# Patient Record
Sex: Female | Born: 1960 | Hispanic: Refuse to answer | Marital: Single | Smoking: Never smoker
Health system: Southern US, Community
[De-identification: ages and names within clinical notes are randomized; demographics above are authoritative.]

## PROBLEM LIST (undated history)

## (undated) DIAGNOSIS — M1990 Unspecified osteoarthritis, unspecified site: Secondary | ICD-10-CM

## (undated) DIAGNOSIS — G35 Multiple sclerosis: Secondary | ICD-10-CM

## (undated) DIAGNOSIS — R42 Dizziness and giddiness: Secondary | ICD-10-CM

## (undated) DIAGNOSIS — N329 Bladder disorder, unspecified: Secondary | ICD-10-CM

## (undated) DIAGNOSIS — G43909 Migraine, unspecified, not intractable, without status migrainosus: Secondary | ICD-10-CM

## (undated) DIAGNOSIS — M4800 Spinal stenosis, site unspecified: Secondary | ICD-10-CM

## (undated) HISTORY — PX: ABDOMINAL HYSTERECTOMY: SHX81

## (undated) HISTORY — PX: NECK SURGERY: SHX720

## (undated) HISTORY — PX: CHOLECYSTECTOMY: SHX55

---

## 2008-11-04 ENCOUNTER — Ambulatory Visit: Payer: Self-pay | Admitting: Gastroenterology

## 2008-11-21 ENCOUNTER — Ambulatory Visit: Payer: Self-pay | Admitting: Gastroenterology

## 2008-12-21 ENCOUNTER — Ambulatory Visit: Payer: Self-pay | Admitting: Gastroenterology

## 2009-12-05 ENCOUNTER — Ambulatory Visit: Payer: Self-pay | Admitting: Gastroenterology

## 2009-12-19 ENCOUNTER — Ambulatory Visit: Payer: Self-pay | Admitting: Gastroenterology

## 2010-01-02 ENCOUNTER — Ambulatory Visit: Payer: Self-pay | Admitting: Gastroenterology

## 2010-01-16 ENCOUNTER — Ambulatory Visit: Payer: Self-pay | Admitting: Gastroenterology

## 2011-03-01 ENCOUNTER — Ambulatory Visit (INDEPENDENT_AMBULATORY_CARE_PROVIDER_SITE_OTHER): Payer: Self-pay

## 2011-03-01 DIAGNOSIS — K5900 Constipation, unspecified: Secondary | ICD-10-CM

## 2011-03-18 ENCOUNTER — Ambulatory Visit (INDEPENDENT_AMBULATORY_CARE_PROVIDER_SITE_OTHER): Payer: Self-pay

## 2011-03-18 ENCOUNTER — Ambulatory Visit: Payer: Self-pay

## 2011-03-18 DIAGNOSIS — K5900 Constipation, unspecified: Secondary | ICD-10-CM

## 2011-04-01 ENCOUNTER — Ambulatory Visit (INDEPENDENT_AMBULATORY_CARE_PROVIDER_SITE_OTHER): Payer: Self-pay

## 2011-04-01 DIAGNOSIS — K5900 Constipation, unspecified: Secondary | ICD-10-CM

## 2011-04-16 ENCOUNTER — Ambulatory Visit: Payer: Self-pay

## 2011-05-15 ENCOUNTER — Ambulatory Visit: Payer: Self-pay

## 2011-06-14 ENCOUNTER — Ambulatory Visit: Payer: Self-pay

## 2012-02-12 ENCOUNTER — Ambulatory Visit: Payer: Self-pay

## 2012-07-29 ENCOUNTER — Ambulatory Visit: Payer: Self-pay

## 2012-10-13 ENCOUNTER — Ambulatory Visit: Payer: Self-pay

## 2015-06-26 ENCOUNTER — Encounter (HOSPITAL_BASED_OUTPATIENT_CLINIC_OR_DEPARTMENT_OTHER): Payer: Self-pay | Admitting: Emergency Medicine

## 2015-06-26 ENCOUNTER — Other Ambulatory Visit: Payer: Self-pay

## 2015-06-26 ENCOUNTER — Emergency Department (HOSPITAL_BASED_OUTPATIENT_CLINIC_OR_DEPARTMENT_OTHER): Payer: Medicaid Other

## 2015-06-26 ENCOUNTER — Emergency Department (HOSPITAL_BASED_OUTPATIENT_CLINIC_OR_DEPARTMENT_OTHER)
Admission: EM | Admit: 2015-06-26 | Discharge: 2015-06-26 | Disposition: A | Discharge status: Home / Self Care | Payer: Medicaid Other | Attending: Emergency Medicine | Admitting: Emergency Medicine

## 2015-06-26 DIAGNOSIS — R42 Dizziness and giddiness: Secondary | ICD-10-CM | POA: Insufficient documentation

## 2015-06-26 DIAGNOSIS — R05 Cough: Secondary | ICD-10-CM | POA: Insufficient documentation

## 2015-06-26 DIAGNOSIS — R0789 Other chest pain: Secondary | ICD-10-CM | POA: Insufficient documentation

## 2015-06-26 DIAGNOSIS — M791 Myalgia: Secondary | ICD-10-CM | POA: Diagnosis present

## 2015-06-26 DIAGNOSIS — N390 Urinary tract infection, site not specified: Secondary | ICD-10-CM | POA: Diagnosis not present

## 2015-06-26 DIAGNOSIS — R6889 Other general symptoms and signs: Secondary | ICD-10-CM

## 2015-06-26 DIAGNOSIS — R0602 Shortness of breath: Secondary | ICD-10-CM | POA: Diagnosis not present

## 2015-06-26 DIAGNOSIS — R0981 Nasal congestion: Secondary | ICD-10-CM | POA: Diagnosis not present

## 2015-06-26 DIAGNOSIS — R51 Headache: Secondary | ICD-10-CM | POA: Insufficient documentation

## 2015-06-26 DIAGNOSIS — R5383 Other fatigue: Secondary | ICD-10-CM | POA: Insufficient documentation

## 2015-06-26 HISTORY — DX: Spinal stenosis, site unspecified: M48.00

## 2015-06-26 HISTORY — DX: Multiple sclerosis: G35

## 2015-06-26 HISTORY — DX: Bladder disorder, unspecified: N32.9

## 2015-06-26 HISTORY — DX: Unspecified osteoarthritis, unspecified site: M19.90

## 2015-06-26 LAB — URINALYSIS, ROUTINE W REFLEX MICROSCOPIC
BILIRUBIN URINE: NEGATIVE
GLUCOSE, UA: NEGATIVE mg/dL
HGB URINE DIPSTICK: NEGATIVE
Ketones, ur: NEGATIVE mg/dL
NITRITE: NEGATIVE
PH: 7 (ref 5.0–8.0)
Protein, ur: NEGATIVE mg/dL
SPECIFIC GRAVITY, URINE: 1.014 (ref 1.005–1.030)

## 2015-06-26 LAB — CBC
HCT: 42.4 % (ref 36.0–46.0)
HEMOGLOBIN: 13.3 g/dL (ref 12.0–15.0)
MCH: 28.1 pg (ref 26.0–34.0)
MCHC: 31.4 g/dL (ref 30.0–36.0)
MCV: 89.6 fL (ref 78.0–100.0)
PLATELETS: 242 10*3/uL (ref 150–400)
RBC: 4.73 MIL/uL (ref 3.87–5.11)
RDW: 14.6 % (ref 11.5–15.5)
WBC: 7.1 10*3/uL (ref 4.0–10.5)

## 2015-06-26 LAB — URINE MICROSCOPIC-ADD ON

## 2015-06-26 LAB — COMPREHENSIVE METABOLIC PANEL
ALBUMIN: 3.9 g/dL (ref 3.5–5.0)
ALK PHOS: 61 U/L (ref 38–126)
ALT: 18 U/L (ref 14–54)
ANION GAP: 8 (ref 5–15)
AST: 26 U/L (ref 15–41)
BILIRUBIN TOTAL: 0.7 mg/dL (ref 0.3–1.2)
BUN: 9 mg/dL (ref 6–20)
CALCIUM: 9.4 mg/dL (ref 8.9–10.3)
CO2: 27 mmol/L (ref 22–32)
CREATININE: 1.18 mg/dL — AB (ref 0.44–1.00)
Chloride: 103 mmol/L (ref 101–111)
GFR calc Af Amer: 60 mL/min — ABNORMAL LOW (ref >60)
GFR calc non Af Amer: 52 mL/min — ABNORMAL LOW (ref >60)
GLUCOSE: 117 mg/dL — AB (ref 65–99)
Potassium: 4.1 mmol/L (ref 3.5–5.1)
Sodium: 138 mmol/L (ref 135–145)
TOTAL PROTEIN: 8.1 g/dL (ref 6.5–8.1)

## 2015-06-26 LAB — TROPONIN I

## 2015-06-26 LAB — LIPASE, BLOOD: Lipase: 26 U/L (ref 11–51)

## 2015-06-26 LAB — I-STAT CG4 LACTIC ACID, ED: Lactic Acid, Venous: 0.97 mmol/L (ref 0.5–2.0)

## 2015-06-26 MED ORDER — ACETAMINOPHEN 325 MG PO TABS
ORAL_TABLET | ORAL | Status: AC
  Administered 2015-06-26: 650 mg
  Filled 2015-06-26: qty 2

## 2015-06-26 MED ORDER — METOCLOPRAMIDE HCL 5 MG/ML IJ SOLN
10.0000 mg | Freq: Once | INTRAMUSCULAR | Status: AC
  Administered 2015-06-26: 10 mg via INTRAVENOUS
  Filled 2015-06-26: qty 2

## 2015-06-26 MED ORDER — DEXTROSE 5 % IV SOLN
1.0000 g | Freq: Once | INTRAVENOUS | Status: AC
  Administered 2015-06-26: 1 g via INTRAVENOUS
  Filled 2015-06-26: qty 10

## 2015-06-26 MED ORDER — DEXAMETHASONE SODIUM PHOSPHATE 10 MG/ML IJ SOLN
10.0000 mg | Freq: Once | INTRAMUSCULAR | Status: AC
  Administered 2015-06-26: 10 mg via INTRAVENOUS
  Filled 2015-06-26: qty 1

## 2015-06-26 MED ORDER — SODIUM CHLORIDE 0.9 % IV BOLUS (SEPSIS)
1000.0000 mL | Freq: Once | INTRAVENOUS | Status: AC
  Administered 2015-06-26: 1000 mL via INTRAVENOUS

## 2015-06-26 MED ORDER — KETOROLAC TROMETHAMINE 30 MG/ML IJ SOLN
30.0000 mg | Freq: Once | INTRAMUSCULAR | Status: AC
  Administered 2015-06-26: 30 mg via INTRAVENOUS
  Filled 2015-06-26: qty 1

## 2015-06-26 MED ORDER — ONDANSETRON HCL 4 MG/2ML IJ SOLN
4.0000 mg | Freq: Once | INTRAMUSCULAR | Status: AC | PRN
  Administered 2015-06-26: 4 mg via INTRAVENOUS
  Filled 2015-06-26: qty 2

## 2015-06-26 MED ORDER — CEPHALEXIN 500 MG PO CAPS: 500.0000 mg | ORAL_CAPSULE | Freq: Three times a day (TID) | ORAL | Status: DC

## 2015-06-26 MED ORDER — DIPHENHYDRAMINE HCL 50 MG/ML IJ SOLN
12.5000 mg | Freq: Once | INTRAMUSCULAR | Status: AC
  Administered 2015-06-26: 12.5 mg via INTRAVENOUS
  Filled 2015-06-26: qty 1

## 2015-06-26 MED ORDER — OSELTAMIVIR PHOSPHATE 75 MG PO CAPS: 75.0000 mg | ORAL_CAPSULE | Freq: Two times a day (BID) | ORAL | Status: DC

## 2015-06-26 NOTE — ED Notes (Signed)
Patient states that she went to [facility] and was told that is was a 4 hour wait. The patient had a fever of 99.5 at [facility] - no treatment. The patient reports that she is having generalized pain, and weakness all over with nausea. Patient has fever of 101.5 here. The patient also reports that she has pain in her chest area when she breaths.

## 2015-06-26 NOTE — Discharge Instructions (Signed)
1. Medications: tamiflu, keflex, usual home medications 2. Treatment: rest, drink plenty of fluids 3. Follow Up: please followup with your primary doctor for discussion of your diagnoses and further evaluation after today's visit; if you do not have a primary care doctor use the resource guide provided to find one; please return to the ER for high fever, increased pain, new or worsening symptoms   Emergency Department Resource Guide 1) Find a Doctor and Pay Out of Pocket Although you won't have to find out who is covered by your insurance plan, it is a good idea to ask around and get recommendations. You will then need to call the office and see if the doctor you have chosen will accept you as a new patient and what types of options they offer for patients who are self-pay. Some doctors offer discounts or will set up payment plans for their patients who do not have insurance, but you will need to ask so you aren't surprised when you get to your appointment.  2) Contact Your Local Health Department Not all health departments have doctors that can see patients for sick visits, but many do, so it is worth a call to see if yours does. If you don't know where your local health department is, you can check in your phone book. The CDC also has a tool to help you locate your state's health department, and many state websites also have listings of all of their local health departments.  3) Find a Walk-in Clinic If your illness is not likely to be very severe or complicated, you may want to try a walk in clinic. These are popping up all over the country in pharmacies, drugstores, and shopping centers. They're usually staffed by nurse practitioners or physician assistants that have been trained to treat common illnesses and complaints. They're usually fairly quick and inexpensive. However, if you have serious medical issues or chronic medical problems, these are probably not your best option.  No Primary Care  Doctor: - Call Health Connect at  [phone] - they can help you locate a primary care doctor that  accepts your insurance, provides certain services, etc. - Physician Referral Service- [phone]  Chronic Pain Problems: Organization         Address  Phone   Notes  [facility]  [phone] Patients need to be referred by their primary care doctor.   Medication Assistance: Organization         Address  Phone   Notes  [facility] Medication [facility] [address] [place] [place], [place] [address] [phone] --Must be a resident of [facility] -- Must have NO insurance coverage whatsoever (no Medicaid/ Medicare, etc.) -- The pt. MUST have a primary care doctor that directs their care regularly and follows them in the community   [facility]ist  [phone]   [employer]  [phone]    Agencies that provide inexpensive medical care: Organization         Address  Phone   Notes  [facility]  [phone]   [facility]    [phone]   [facility] [address] [place], [place] [address] [phone]   [facility] [address] [place], [place] [phone]   Planned Parenthood    [phone]   [facility]    [phone]   [facility] and [facility]  [address]  Mardene Speak Phone:  [phone], Fax:  [phone] Hours of Operation:  9 am - 6 pm, M-F.  Also accepts Medicaid/Medicare and self-pay.  [facility] for [place], [address], [place] Phone: [phone], Fax: [phone]. Hours of Operation:  8:30 am - 5:30 pm, M-F.  Also accepts Medicaid and self-pay.  [facility] [address], [place] Phone: [phone]   [place], [place], [place] [phone], Ext. [phone] Mondays & Thursdays: 7-9 AM.  First 15 patients are seen on a first  come, first serve basis.    [place] Providers:  Organization         Address  Phone   Notes  [facility] [address], [place] [phone] Also accepts self-pay patients.  [facility] [id number] [place], [place]  [phone]   [place], [address], [place] [phone]   [facility] [address] [place] [phone]   Lucianne Lei [address], [place]   [phone] Only accepts [place] Access [place] patients after they have their name applied to their card.   Self-Pay (no insurance) in [facility]:  Organization         Address  Phone   Notes  Sickle Cell Patients, [facility] [place] [phone]   [facility] Urgent Care [place] [phone]   [facility] [place]  [place], [place], [place] [phone]   [facility]/Dr. Osei-Bonsu  [address] [place] or [address], [place] [phone] Phone number for both [place] and [place] locations is the same.  [facility]gent [facility] and [facility] [address] [place] [phone]   [facility] [address], [place] or [address] [phone]   [facility] [address], [place] [phone], phone; [phone], fax Sees patients 1st and 3rd Saturday of every month.  Must not qualify for public or private insurance (i.e. Medicaid, Medicare, [place] Health Choice, Veterans' Benefits)  Household income should be no more than 200% of the poverty level The clinic cannot treat you if you are pregnant or think you are pregnant  Sexually transmitted diseases are not treated at the clinic.    Dental Care: Organization          Address  Phone  Notes  [facility] [place] [phone] Accepts children up to age 30 who are enrolled in [place] or [place]; pregnant women with a Medicaid card; and children who have applied for Medicaid or [place] Health Choice, but were declined, whose parents can pay a reduced fee at time of service.  [facility] Department of [facility]  [address], [place] [phone] Accepts children up to age 23 who are enrolled in [place] or [place]; pregnant women with a Medicaid card; and children who have applied for Medicaid or  Health Choice, but were declined, whose parents can pay a reduced fee at time of service.  [facility]  [place] [phone] Patients are seen  by appointment only. Walk-ins are not accepted. [place] will see patients 67 years of age and older. Monday - Tuesday (8am-5pm) Most Wednesdays (8:30-5pm) $30 per visit, cash only  [facility] Access [facility]  [address], [facility] [phone] Patients are seen by appointment only. Walk-ins are not accepted. [place] will see patients 89 years of age and older. One Wednesday Evening (Monthly: Volunteer Based).  $30 per visit, cash only  [place]  [phone] for adults; Children under age 54, call Graduate Pediatric Dentistry at [phone]. Children aged 71-14, please call [phone] to request a pediatric application.  Dental services are provided in all areas of dental care including fillings, crowns and bridges, complete and partial dentures, implants, gum treatment, root canals, and extractions. Preventive care is also provided. Treatment is provided to both adults and children. Patients are selected via a lottery and there is often a waiting list.   [facility] [address], [place]  [phone] [email or web address]   [facility] [address] [place], [place] [phone], Ext. [phone] Second and Fourth Thursday of each month, opens at 6:30 AM; Clinic ends at 9 AM.  Patients are seen on a first-come first-served basis, and a limited number are seen during each clinic.   [facility]  [address] Hillard Danker [place], [place] [phone]   Eligibility Requirements You must have lived in [place], [place], or [place] counties for at least the last three months.   You cannot be eligible for state or federal sponsored [employer], including [employer], [place], or [employer].   You generally cannot be eligible for healthcare insurance through your employer.    How to apply: Eligibility screenings are held every Tuesday and Wednesday afternoon from 1:00 pm until 4:00 pm. You do not need an appointment for the interview!  [facility] [address], [place], [place]   [place]  [place] Department  [place]  [phone]    Behavioral Health Resources in the Community: Intensive Outpatient Programs Organization         Address  Phone  Notes  [place]. [address], [place], [place] [phone]   [facility] Outpatient [address] [place], [place]   ADS: Alcohol & Drug S[facility]cs [address], [place], [place]   [place] [address]  [place], [place] or [phone]   Substance Abuse Resources Organization         Address  Phone  Notes  Alcohol and Drug Services  [phone]   [place]  [phone]   [facility] [place]   Chinita Pester  [phone]   Residential & Outpatient Substance Abuse Program  [phone]   Psychological  Services Organization         Address  Phone  Notes  [facility]  [place]  [phone]   [place] [address] [place] or [phone]    Mobile Crisis Teams Organization         Address  Phone  Notes  [facility], Mobile Crisis Care Unit  [phone]   [facility]ive Psychotherapeutic Services  [address] [place], [place]   [facility] [address] [place]  [phone]    Self-Help/Support Groups Organization         Address  Phone             Notes  Mental Health Assoc. of [place] - variety of support groups  [place] Call for more information  Narcotics Anonymous (NA), Caring Services [address], [employer] [place]  2 meetings at this location   Special educational needs teacher         Address  Phone  Notes  ASAP Residential Treatment [place],    [place]  [phone]   [facility]  [address] [place] [id number], [place], [place]   [place], [place] [phone] Admissions: 8am-3pm M-F  Incentives Substance [place] [address]    [place], [place] [phone]   [facility] [address] [place], [place], [place]   [facility] [address]  [place], [place]   Insight Programs - Intensive Outpatient [address] Kristeen [address], [place], [place]   [facility] ([place].,  [place], [place] [phone] or [phone]   [facility] Services (RTS) [address] [place], [place] Accepts Medicaid  [facility] [place] [address]  [place] [phone] Substance Abuse/Addiction Treatment   [facility] Organization         Address  Phone  Notes  [facility]  [phone]   Domenic Schwab, PhD [address] Arlis Porta [place], [place]   [phone] or [phone]   [place], [place] [phone]   [facility] [address], [place], [place] [phone] Insurance/Medicaid/sponsorship through [facility] and Families [address] [place]                                    [place], [place] [phone] [place] [address], [place] [phone]    Dr. Adele Schilder  [phone]   [facility] Dept. 1) [address] [place] 2) [place] 3)  [address], [place] [phone]  [phone]   [place] [phone] or [phone] (After Hours)

## 2015-06-26 NOTE — ED Notes (Signed)
Pt verbalizes understanding of d/c instructions and denies any further needs at this time. 

## 2015-06-26 NOTE — ED Provider Notes (Signed)
CSN: [id number]     Arrival date & time 06/26/15  1609 History   First MD Initiated Contact with Patient 06/26/15 1650     Chief Complaint  Patient presents with  . Muscle Pain    HPI   Shannon Perkins is a 55 y.o. female with a PMH of MS, spinal stenosis, arthritis who presents to the ED with fever, nasal congestion, cough, generalized body aches, nausea, and vomiting. She notes her symptoms started this morning. She denies exacerbating or alleviating factors. She also reports intermittent central chest pain with cough, shortness of breath, epigastric abdominal pain, and urinary frequency. She denies hematemesis, diarrhea, constipation, hematochezia, melena, dysuria, urgency. Additionally, she states she has a migraine, which she notes is consistent with her history of migraine. She reports associated lightheadedness. She denies vision changes, new focal weakness (has chronic R sided weakness 2/2 MS, unchanged from baseline), numbness, paresthesia.  Past Medical History  Diagnosis Date  . MS (multiple sclerosis) (HCC)   . Spinal stenosis   . Arthritis   . Bladder disorder    Past Surgical History  Procedure Laterality Date  . Neck surgery    . Abdominal hysterectomy     Family History  Problem Relation Age of Onset  . Rashes / Skin problems Mother    Social History  Substance Use Topics  . Smoking status: Never Smoker   . Smokeless tobacco: None  . Alcohol Use: None   OB History    No data available      Review of Systems  Constitutional: Positive for fever, chills and fatigue.  HENT: Positive for congestion.   Eyes: Negative for visual disturbance.  Respiratory: Positive for cough and shortness of breath.   Cardiovascular: Positive for chest pain.  Gastrointestinal: Positive for nausea, vomiting and abdominal pain. Negative for diarrhea and constipation.  Genitourinary: Negative for dysuria, urgency and frequency.  Musculoskeletal: Positive for myalgias.  Neurological:  Positive for light-headedness and headaches. Negative for syncope, weakness and numbness.  All other systems reviewed and are negative.     Allergies  Other  Home Medications   Prior to Admission medications   Medication Sig Start Date End Date Taking? Authorizing Provider  cephALEXin (KEFLEX) 500 MG capsule Take 1 capsule (500 mg total) by mouth 3 (three) times daily. 06/26/15   Mady Gemma, PA-C  oseltamivir (TAMIFLU) 75 MG capsule Take 1 capsule (75 mg total) by mouth every 12 (twelve) hours. 06/26/15   Mady Gemma, PA-C    BP 131/72 mmHg  Pulse 87  Temp(Src) 100.5 F (38.1 C) (Oral)  Resp 20  Ht  (1.626 m)  Wt 112.492 kg  BMI 42.55 kg/m2  SpO2 98% Physical Exam  Constitutional: She is oriented to person, place, and time. She appears well-developed and well-nourished. No distress.  Patient appears uncomfortable due to pain.  HENT:  Head: Normocephalic and atraumatic.  Right Ear: External ear normal.  Left Ear: External ear normal.  Nose: Nose normal.  Mouth/Throat: Uvula is midline, oropharynx is clear and moist and mucous membranes are normal.  Eyes: Conjunctivae, EOM and lids are normal. Pupils are equal, round, and reactive to light. Right eye exhibits no discharge. Left eye exhibits no discharge. No scleral icterus.  Neck: Normal range of motion. Neck supple.  Cardiovascular: Normal rate, regular rhythm, normal heart sounds, intact distal pulses and normal pulses.   Pulmonary/Chest: Effort normal and breath sounds normal. No respiratory distress. She has no wheezes. She has no rales. She  exhibits tenderness.  Anterior chest wall tender to palpation, patient states this reproduces her pain.  Abdominal: Soft. Normal appearance and bowel sounds are normal. She exhibits no distension and no mass. There is tenderness. There is no rigidity, no rebound, no guarding and no CVA tenderness.  Tenderness to palpation in epigastric region. No rebound, guarding,  or masses.  Musculoskeletal: Normal range of motion. She exhibits no edema or tenderness.  Neurological: She is alert and oriented to person, place, and time. No cranial nerve deficit or sensory deficit.  Strength 4/5 right upper and lower extremity, patient states this is unchanged from baseline.  Skin: Skin is warm, dry and intact. No rash noted. She is not diaphoretic. No erythema. No pallor.  Psychiatric: She has a normal mood and affect. Her speech is normal and behavior is normal.  Nursing note and vitals reviewed.   ED Course  Procedures (including critical care time)  Labs Review Labs Reviewed  COMPREHENSIVE METABOLIC PANEL - Abnormal; Notable for the following:    Glucose, Bld 117 (*)    Creatinine, Ser 1.18 (*)    GFR calc non Af Amer 52 (*)    GFR calc Af Amer 60 (*)    All other components within normal limits  URINALYSIS, ROUTINE W REFLEX MICROSCOPIC (NOT AT [facility]) - Abnormal; Notable for the following:    APPearance CLOUDY (*)    Leukocytes, UA LARGE (*)    All other components within normal limits  URINE MICROSCOPIC-ADD ON - Abnormal; Notable for the following:    Squamous Epithelial / LPF 0-5 (*)    Bacteria, UA MANY (*)    All other components within normal limits  URINE CULTURE  LIPASE, BLOOD  CBC  TROPONIN I  I-STAT CG4 LACTIC ACID, ED    Imaging Review Dg Chest 2 View  06/26/2015  CLINICAL DATA:  55 year old female with chest pain and cough EXAM: CHEST  2 VIEW COMPARISON:  None. FINDINGS: The lungs are clear and negative for focal airspace consolidation, pulmonary edema or suspicious pulmonary nodule. No pleural effusion or pneumothorax. Cardiac and mediastinal contours are within normal limits. No acute fracture or lytic or blastic osseous lesions. The visualized upper abdominal bowel gas pattern is unremarkable. Surgical clips in the right upper quadrant suggest prior cholecystectomy. IMPRESSION: No active cardiopulmonary disease. Electronically Signed   By:  Malachy Moan M.D.   On: 06/26/2015 17:53   I have personally reviewed and evaluated these images and lab results as part of my medical decision-making.   EKG Interpretation   Date/Time:  Monday June 26 2015 17:49:55 EST Ventricular Rate:  88 PR Interval:  178 QRS Duration: 90 QT Interval:  368 QTC Calculation: 445 R Axis:   75 Text Interpretation:  Normal sinus rhythm Normal ECG Confirmed by DELO   MD, DOUGLAS ([id number]) on 06/26/2015 7:40:47 PM      MDM   Final diagnoses:  Flu-like symptoms  UTI (lower urinary tract infection)    55 year old female presents with fever, nasal congestion, cough, generalized body aches, nausea, and vomiting. Also reports intermittent central chest pain with cough, shortness of breath, epigastric abdominal pain, and urinary frequency. Denies hematemesis, diarrhea, constipation, hematochezia, melena, dysuria, urgency. States she has a migraine, which she notes is consistent with her history of migraine. Reports associated lightheadedness. Denies vision changes, new focal weakness (has chronic R sided weakness 2/2 MS, unchanged from baseline), numbness, paresthesia.  Patient's initial temp 101.5. No tachycardia or hypotension. Heart regular rate and  rhythm. Lungs clear to auscultation bilaterally. Anterior chest wall tender to palpation, patient states this reproduces her pain. Abdomen soft, non-distended, with mild tenderness to palpation in epigastric region. No rebound, guarding, or masses. Strength 4/5 in RUE and RLE, which patient states is unchanged from baseline. Otherwise, normal neuro exam with no focal deficit.  EKG sinus rhythm. Troponin negative. Chest x-Ballew no active cardiopulmonary disease. CBC negative for leukocytosis or anemia. CMP remarkable for creatinine 1.18. Lipase within normal limits. Lactic acid within normal limits. UA remarkable for large leukocytes, 6-30 WBC, many bacteria. Urine culture ordered.  Patient given fluids,  reglan, benadryl, and toradol in the ED. On reassessment, she reports significant symptom improvement. Symptoms flu like, no flu testing available, however will treat with tamiflu given onset of symptoms within 48 hours. Will give dose of rocephin in the ED for UTI and discharge with keflex. Patient is non-toxic and well-appearing, feel she is stable for discharge at this time. Repeat temp 98.5. Patient to follow-up with PCP. Return precautions discussed. Patient verbalizes her understanding and is in agreement with plan.  BP 120/70 mmHg  Pulse 80  Temp(Src) 98.5 F (36.9 C) (Oral)  Resp 18  Ht 5\' 4"  (1.626 m)  Wt 112.492 kg  BMI 42.55 kg/m2  SpO2 98%       Mady Gemma, PA-C 06/27/15 0217  Geoffery Lyons, MD 06/28/15 [phone]

## 2015-06-29 LAB — URINE CULTURE: Culture: >=100000

## 2015-06-30 ENCOUNTER — Telehealth (HOSPITAL_COMMUNITY): Payer: Self-pay

## 2015-06-30 NOTE — Telephone Encounter (Signed)
Post ED Visit - Positive Culture Follow-up  Culture report reviewed by antimicrobial stewardship pharmacist:  []  Enzo Bi, Pharm.D. []  Celedonio Miyamoto, Pharm.D., BCPS []  Garvin Fila, Pharm.D. []  Georgina Pillion, Pharm.D., BCPS [x]  [place], [place].D., BCPS, AAHIVP []  Estella Husk, Pharm.D., BCPS, AAHIVP []  Tennis Must, Pharm.D. []  Sherle Poe, [place].D.  Positive urine culture, >/= 100,000 colonies -> E Coli Treated with Cephalexin, organism sensitive to the same and no further patient follow-up is required at this time.  Arvid Right 06/30/2015, 11:58 AM

## 2015-08-11 ENCOUNTER — Emergency Department (HOSPITAL_BASED_OUTPATIENT_CLINIC_OR_DEPARTMENT_OTHER)
Admission: EM | Admit: 2015-08-11 | Discharge: 2015-08-11 | Disposition: A | Discharge status: Home / Self Care | Payer: Medicaid Other | Attending: Emergency Medicine | Admitting: Emergency Medicine

## 2015-08-11 ENCOUNTER — Other Ambulatory Visit: Payer: Self-pay

## 2015-08-11 ENCOUNTER — Emergency Department (HOSPITAL_BASED_OUTPATIENT_CLINIC_OR_DEPARTMENT_OTHER): Payer: Medicaid Other

## 2015-08-11 ENCOUNTER — Encounter (HOSPITAL_BASED_OUTPATIENT_CLINIC_OR_DEPARTMENT_OTHER): Payer: Self-pay | Admitting: Emergency Medicine

## 2015-08-11 DIAGNOSIS — Y998 Other external cause status: Secondary | ICD-10-CM | POA: Diagnosis not present

## 2015-08-11 DIAGNOSIS — Z8739 Personal history of other diseases of the musculoskeletal system and connective tissue: Secondary | ICD-10-CM | POA: Diagnosis not present

## 2015-08-11 DIAGNOSIS — S0990XA Unspecified injury of head, initial encounter: Secondary | ICD-10-CM | POA: Diagnosis not present

## 2015-08-11 DIAGNOSIS — Y9289 Other specified places as the place of occurrence of the external cause: Secondary | ICD-10-CM | POA: Insufficient documentation

## 2015-08-11 DIAGNOSIS — S99921A Unspecified injury of right foot, initial encounter: Secondary | ICD-10-CM | POA: Insufficient documentation

## 2015-08-11 DIAGNOSIS — R040 Epistaxis: Secondary | ICD-10-CM | POA: Diagnosis present

## 2015-08-11 DIAGNOSIS — S99922A Unspecified injury of left foot, initial encounter: Secondary | ICD-10-CM | POA: Diagnosis not present

## 2015-08-11 DIAGNOSIS — W1839XA Other fall on same level, initial encounter: Secondary | ICD-10-CM | POA: Diagnosis not present

## 2015-08-11 DIAGNOSIS — Z8669 Personal history of other diseases of the nervous system and sense organs: Secondary | ICD-10-CM | POA: Insufficient documentation

## 2015-08-11 DIAGNOSIS — Z87448 Personal history of other diseases of urinary system: Secondary | ICD-10-CM | POA: Diagnosis not present

## 2015-08-11 DIAGNOSIS — S4992XA Unspecified injury of left shoulder and upper arm, initial encounter: Secondary | ICD-10-CM | POA: Insufficient documentation

## 2015-08-11 DIAGNOSIS — Y9389 Activity, other specified: Secondary | ICD-10-CM | POA: Insufficient documentation

## 2015-08-11 LAB — COMPREHENSIVE METABOLIC PANEL
ALT: 16 U/L (ref 14–54)
ANION GAP: 8 (ref 5–15)
AST: 20 U/L (ref 15–41)
Albumin: 3.9 g/dL (ref 3.5–5.0)
Alkaline Phosphatase: 55 U/L (ref 38–126)
BUN: 14 mg/dL (ref 6–20)
CALCIUM: 9.3 mg/dL (ref 8.9–10.3)
CO2: 24 mmol/L (ref 22–32)
Chloride: 106 mmol/L (ref 101–111)
Creatinine, Ser: 0.99 mg/dL (ref 0.44–1.00)
GFR calc non Af Amer: >60 mL/min (ref >60)
GLUCOSE: 98 mg/dL (ref 65–99)
POTASSIUM: 3.8 mmol/L (ref 3.5–5.1)
SODIUM: 138 mmol/L (ref 135–145)
TOTAL PROTEIN: 7.8 g/dL (ref 6.5–8.1)
Total Bilirubin: 0.7 mg/dL (ref 0.3–1.2)

## 2015-08-11 LAB — CBC WITH DIFFERENTIAL/PLATELET
BASOS ABS: 0 10*3/uL (ref 0.0–0.1)
BASOS PCT: 0 %
EOS PCT: 2 %
Eosinophils Absolute: 0.2 10*3/uL (ref 0.0–0.7)
HCT: 37.7 % (ref 36.0–46.0)
Hemoglobin: 12.4 g/dL (ref 12.0–15.0)
LYMPHS PCT: 40 %
Lymphs Abs: 2.9 10*3/uL (ref 0.7–4.0)
MCH: 29.2 pg (ref 26.0–34.0)
MCHC: 32.9 g/dL (ref 30.0–36.0)
MCV: 88.7 fL (ref 78.0–100.0)
MONO ABS: 0.6 10*3/uL (ref 0.1–1.0)
Monocytes Relative: 9 %
Neutro Abs: 3.5 10*3/uL (ref 1.7–7.7)
Neutrophils Relative %: 49 %
PLATELETS: 307 10*3/uL (ref 150–400)
RBC: 4.25 MIL/uL (ref 3.87–5.11)
RDW: 14.3 % (ref 11.5–15.5)
WBC: 7.2 10*3/uL (ref 4.0–10.5)

## 2015-08-11 LAB — BRAIN NATRIURETIC PEPTIDE: B NATRIURETIC PEPTIDE 5: 73.8 pg/mL (ref 0.0–100.0)

## 2015-08-11 LAB — TROPONIN I

## 2015-08-11 MED ORDER — HYDROCODONE-ACETAMINOPHEN 5-325 MG PO TABS
2.0000 | ORAL_TABLET | Freq: Once | ORAL | Status: AC
  Administered 2015-08-11: 2 via ORAL
  Filled 2015-08-11: qty 2

## 2015-08-11 MED ORDER — HYDROCODONE-ACETAMINOPHEN 5-325 MG PO TABS
1.0000 | ORAL_TABLET | Freq: Four times a day (QID) | ORAL | Status: DC | PRN
Start: 2015-08-11 — End: 2018-07-06

## 2015-08-11 NOTE — ED Notes (Signed)
Pt states that since this morning she started having left shoulder pain with HA on left side and bilateral feet swelling

## 2015-08-11 NOTE — Discharge Instructions (Signed)
Get saline nasal spray and spray one time into each nostril every 2 hours while awake. If nosebleed recurs pinch your nostrils shut while sitting upright in a chair for 20 minutes. If you can't stop the bleeding stop, return to the emergency department. Take Tylenol for mild pain or the pain medicine prescribed for bad pain. Don't take Tylenol together with the pain medicine prescribed as the combination can be dangerous to your liver. See your primary care physician if having significant pain by next week. X-rays, EKG and blood work were all normal today Nosebleed Nosebleeds are common. They are due to a crack in the inside lining of your nose (mucous membrane) or from a small blood vessel that starts to bleed. Nosebleeds can be caused by many conditions, such as injury, infections, dry mucous membranes or dry climate, medicines, nose picking, and home heating and cooling systems. Most nosebleeds come from blood vessels in the front of your nose. HOME CARE INSTRUCTIONS   Try controlling your nosebleed by pinching your nostrils gently and continuously for at least 10 minutes.  Avoid blowing or sniffing your nose for a number of hours after having a nosebleed.  Do not put gauze inside your nose yourself. If your nose was packed by your health care provider, try to maintain the pack inside of your nose until your health care provider removes it.  If a gauze pack was used and it starts to fall out, gently replace it or cut off the end of it.  If a balloon catheter was used to pack your nose, do not cut or remove it unless your health care provider has instructed you to do that.  Avoid lying down while you are having a nosebleed. Sit up and lean forward.  Use a nasal spray decongestant to help with a nosebleed as directed by your health care provider.  Do not use petroleum jelly or mineral oil in your nose. These can drip into your lungs.  Maintain humidity in your home by using less air  conditioning or by using a humidifier.  Aspirinand blood thinners make bleeding more likely. If you are prescribed these medicines and you suffer from nosebleeds, ask your health care provider if you should stop taking the medicines or adjust the dose. Do not stop medicines unless directed by your health care provider  Resume your normal activities as you are able, but avoid straining, lifting, or bending at the waist for several days.  If your nosebleed was caused by dry mucous membranes, use over-the-counter saline nasal spray or gel. This will keep the mucous membranes moist and allow them to heal. If you must use a lubricant, choose the water-soluble variety. Use it only sparingly, and do not use it within several hours of lying down.  Keep all follow-up visits as directed by your health care provider. This is important. SEEK MEDICAL CARE IF:  You have a fever.  You get frequent nosebleeds.  You are getting nosebleeds more often. SEEK IMMEDIATE MEDICAL CARE IF:  Your nosebleed lasts longer than 20 minutes.  Your nosebleed occurs after an injury to your face, and your nose looks crooked or broken.  You have unusual bleeding from other parts of your body.  You have unusual bruising on other parts of your body.  You feel light-headed or you faint.  You become sweaty.  You vomit blood.  Your nosebleed occurs after a head injury.   This information is not intended to replace advice given to you by  your health care provider. Make sure you discuss any questions you have with your health care provider.   Document Released: 02/06/2005 Document Revised: 05/20/2014 Document Reviewed: 12/13/2013 Elsevier Interactive Patient Education [employer]

## 2015-08-11 NOTE — ED Notes (Signed)
IV catheter removed from right AC prior to D/C, insertion site clean, dry and intact. IV catheter intact as well.

## 2015-08-11 NOTE — ED Notes (Signed)
Family at bedside. 

## 2015-08-11 NOTE — ED Provider Notes (Signed)
CSN: [id number]     Arrival date & time 08/11/15  1748 History  By signing my name below, I, Emmanuella Mensah, attest that this documentation has been prepared under the direction and in the presence of Doug Sou, MD. Electronically Signed: Angelene Giovanni, ED Scribe. 08/11/2015. 7:53 PM.    Chief Complaint  Patient presents with  . Shoulder Pain    left   The history is provided by the patient. No language interpreter was used.   HPI Comments: Shannon Perkins is a 55 y.o. 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 She reports associated mild Left-sided HA not consistent with her migraines and mild SOB after walking for the past several weeks. She states that she had a left nosebleed this am and when she went to the bathroom to clean up, she had lightheadedness and she woke up on the floor. She explains that moving her shoulder makes the pain worse but holding it still makes the shoulder pain better. She also reports bilateral foot swelling since this morning with increased pain when putting on her shoes. No other associated symptoms Pt is no longer a smoker, stating that she quit approx. 7 years ago. She denies that she drinks alcohol. She denies any abdominal pain or n/v/d.   She also complains of constant bilateral feet swelling onset this am. She states she noticed the swelling today because it was painful to put her shoes on and her bilateral feet are hurt to the touch. No alleviating factors noted.    Past Medical History  Diagnosis Date  . MS (multiple sclerosis) (HCC)   . Spinal stenosis   . Arthritis   . Bladder disorder    Past Surgical History  Procedure Laterality Date  . Neck surgery    . Abdominal hysterectomy     Family History  Problem Relation Age of Onset  . Rashes / Skin problems Mother    Social  History  Substance Use Topics  . Smoking status: Never Smoker   . Smokeless tobacco: None  . Alcohol Use: None   OB History    No data available     Review of Systems  Constitutional: Negative.   HENT: Positive for nosebleeds.   Respiratory: Negative.   Cardiovascular: Negative.   Gastrointestinal: Negative.  Negative for nausea, vomiting and abdominal pain.  Musculoskeletal: Positive for gait problem. Arthralgias: left shoulder.       Bilateral foot swelling and pain. Walks with limp favoring right leg chronically as result of multiple sclerosis  Skin: Negative.   Neurological: Positive for headaches.  Psychiatric/Behavioral: Negative.   All other systems reviewed and are negative.     Allergies  Other  Home Medications   Prior to Admission medications   Not on File   BP 149/96 mmHg  Pulse 68  Temp(Src) 98 F (36.7 C) (Oral)  Resp 18  Ht 5\' 5"  (1.651 m)  Wt 258 lb (117.028 kg)  BMI 42.93 kg/m2  SpO2 100% Physical Exam  Constitutional: She is oriented to person, place, and time. She appears well-developed and well-nourished. No distress.  HENT:  Head: Normocephalic and atraumatic.  No facial asymmetry  Eyes: Conjunctivae are normal. Pupils are equal, round, and reactive to light.  Neck: Neck supple. No tracheal deviation present. No thyromegaly present.  No bruit  Cardiovascular: Normal rate and regular rhythm.  No murmur heard. Pulmonary/Chest: Effort normal and breath sounds normal.  Abdominal: Soft. Bowel sounds are normal. She exhibits no distension. There is no tenderness.  Obese  Musculoskeletal: Normal range of motion. She exhibits tenderness. She exhibits no edema.  Left upper extremity tender at shoulder pain is exacerbated by active motion. She has full range of motion but has pain at anterior shoulder with active motion. No deformity no swelling. Radial pulse 2+. Bilateral lower extremities without edema. She is minimally tender over the dorsal  aspect of bilateral feet. DP pulses 2+ bilaterally good Capillary refill. No redness  Neurological: She is alert and oriented to person, place, and time. No cranial nerve deficit. Coordination normal.  Gait normal  Skin: Skin is warm and dry. No rash noted.  Psychiatric: She has a normal mood and affect.  Nursing note and vitals reviewed.   ED Course  Procedures (including critical care time) DIAGNOSTIC STUDIES: Oxygen Saturation is 100% on RA, normal by my interpretation.    COORDINATION OF CARE: 7:50 PM- Pt advised of plan for treatment and pt agrees. Pt will receive chest and left shoulder x-[place] for further evaluation.    Labs Review Labs Reviewed  COMPREHENSIVE METABOLIC PANEL  CBC WITH DIFFERENTIAL/PLATELET  BRAIN NATRIURETIC PEPTIDE    Imaging Review No results found. I have personally reviewed and evaluated these images and lab results as part of my medical decision-making.   EKG Interpretation   Date/Time:  Friday August 11 2015 17:54:43 EDT Ventricular Rate:  70 PR Interval:  164 QRS Duration: 82 QT Interval:  394 QTC Calculation: 425 R Axis:   71 Text Interpretation:  Normal sinus rhythm Otherwise normal ECG No  significant change since last tracing Confirmed by Ethelda Chick  MD, Nesha Counihan  [phone]) on 08/11/2015 6:16:08 PM     Pain is improved impression wrist comfortably after treatment with Norco. X-rays viewed by me Results for orders placed or performed during the hospital encounter of 08/11/15  Comprehensive metabolic panel  Result Value Ref Range   Sodium 138 135 - 145 mmol/L   Potassium 3.8 3.5 - 5.1 mmol/L   Chloride 106 101 - 111 mmol/L   CO2 24 22 - 32 mmol/L   Glucose, Bld 98 65 - 99 mg/dL   BUN 14 6 - 20 mg/dL   Creatinine, Ser 6.04 0.44 - 1.00 mg/dL   Calcium 9.3 8.9 - 54.0 mg/dL   Total Protein 7.8 6.5 - 8.1 g/dL   Albumin 3.9 3.5 - 5.0 g/dL   AST 20 15 - 41 U/L   ALT 16 14 - 54 U/L   Alkaline Phosphatase 55 38 - 126 U/L   Total Bilirubin 0.7  0.3 - 1.2 mg/dL   GFR calc non Af Amer >60 >60 mL/min   GFR calc Af Amer >60 >60 mL/min   Anion gap 8 5 - 15  CBC with Differential/Platelet  Result Value Ref Range   WBC 7.2 4.0 - 10.5 K/uL   RBC 4.25 3.87 - 5.11 MIL/uL   Hemoglobin 12.4 12.0 - 15.0 g/dL   HCT 98.1 19.1 - 47.8 %   MCV 88.7 78.0 - 100.0 fL   MCH 29.2 26.0 - 34.0 pg   MCHC 32.9 30.0 - 36.0 g/dL   RDW 29.5 62.1 - 30.8 %   Platelets 307 150 - 400 K/uL   Neutrophils Relative % 49 %   Neutro Abs 3.5 1.7 - 7.7 K/uL   Lymphocytes Relative 40 %   Lymphs Abs 2.9 0.7 -  4.0 K/uL   Monocytes Relative 9 %   Monocytes Absolute 0.6 0.1 - 1.0 K/uL   Eosinophils Relative 2 %   Eosinophils Absolute 0.2 0.0 - 0.7 K/uL   Basophils Relative 0 %   Basophils Absolute 0.0 0.0 - 0.1 K/uL  Brain natriuretic peptide  Result Value Ref Range   B Natriuretic Peptide 73.8 0.0 - 100.0 pg/mL  Troponin I  Result Value Ref Range   Troponin I <0.03 <0.031 ng/mL   Dg Chest 2 View  08/11/2015  CLINICAL DATA:  Mild SOB for a few weeks while walking EXAM: CHEST  2 VIEW COMPARISON:  06/26/2015 FINDINGS: Lateral view degraded by patient arm position. Cervical spine fixation. Midline trachea. Normal heart size and mediastinal contours. Suspicion of mild right pleural thickening, without pleural fluid. Clear lungs. IMPRESSION: No acute cardiopulmonary disease. Electronically Signed   By: Jeronimo Greaves M.D.   On: 08/11/2015 20:22   Dg Shoulder Left  08/11/2015  CLINICAL DATA:  Acute left shoulder pain following fall today. Initial encounter. EXAM: LEFT SHOULDER - 2+ VIEW COMPARISON:  None. FINDINGS: There is no evidence of fracture or dislocation. There is no evidence of arthropathy or other focal bone abnormality. Soft tissues are unremarkable. IMPRESSION: Negative. Electronically Signed   By: Harmon Pier M.D.   On: 08/11/2015 20:22    MDM  I suspect the patient had a vasovagal syncope after she discovered that she had nosebleed. Left shoulder pain is  musculoskeletal. No evidence of congestive heart failure. Plan prescription Norco. Follow-up with PMD if significant pain by next week Final diagnoses:  None   Diagnosis #1 epistaxis #2 syncope #3 left shoulder pain #4 bilateral foot pain  I personally performed the services described in this documentation, which was scribed in my presence. The recorded information has been reviewed and considered.     Doug Sou, MD 08/11/15 2141

## 2017-01-06 ENCOUNTER — Ambulatory Visit (HOSPITAL_COMMUNITY)
Admission: RE | Admit: 2017-01-06 | Discharge: 2017-01-06 | Disposition: A | Discharge status: Home / Self Care | Payer: Medicaid Other | Source: Ambulatory Visit | Attending: Research Study | Admitting: Research Study

## 2017-01-06 ENCOUNTER — Other Ambulatory Visit (HOSPITAL_COMMUNITY): Payer: Self-pay | Admitting: Research Study

## 2017-01-06 DIAGNOSIS — R1010 Upper abdominal pain, unspecified: Secondary | ICD-10-CM

## 2017-01-17 ENCOUNTER — Other Ambulatory Visit (HOSPITAL_COMMUNITY): Payer: Self-pay | Admitting: Research Study

## 2017-01-17 DIAGNOSIS — R1010 Upper abdominal pain, unspecified: Secondary | ICD-10-CM

## 2017-01-20 ENCOUNTER — Ambulatory Visit (HOSPITAL_COMMUNITY)
Admission: RE | Admit: 2017-01-20 | Discharge: 2017-01-20 | Disposition: A | Discharge status: Home / Self Care | Payer: Self-pay | Source: Ambulatory Visit | Attending: Research Study | Admitting: Research Study

## 2017-01-20 DIAGNOSIS — R1010 Upper abdominal pain, unspecified: Secondary | ICD-10-CM

## 2017-01-24 ENCOUNTER — Other Ambulatory Visit (HOSPITAL_COMMUNITY): Payer: Self-pay | Admitting: Research Study

## 2017-01-24 DIAGNOSIS — R1010 Upper abdominal pain, unspecified: Secondary | ICD-10-CM

## 2017-02-05 ENCOUNTER — Ambulatory Visit (HOSPITAL_COMMUNITY): Payer: Medicaid Other | Attending: Research Study

## 2017-02-05 ENCOUNTER — Encounter (HOSPITAL_COMMUNITY): Payer: Self-pay

## 2017-03-26 ENCOUNTER — Other Ambulatory Visit (HOSPITAL_COMMUNITY): Payer: Self-pay | Admitting: Research Study

## 2017-03-26 DIAGNOSIS — Z006 Encounter for examination for normal comparison and control in clinical research program: Secondary | ICD-10-CM

## 2017-03-27 ENCOUNTER — Ambulatory Visit (HOSPITAL_COMMUNITY)
Admission: RE | Admit: 2017-03-27 | Discharge: 2017-03-27 | Disposition: A | Discharge status: Home / Self Care | Payer: Medicaid Other | Source: Ambulatory Visit | Attending: Research Study | Admitting: Research Study

## 2017-03-27 DIAGNOSIS — Z006 Encounter for examination for normal comparison and control in clinical research program: Secondary | ICD-10-CM

## 2017-03-27 DIAGNOSIS — Z90710 Acquired absence of both cervix and uterus: Secondary | ICD-10-CM | POA: Insufficient documentation

## 2017-03-27 DIAGNOSIS — R9389 Abnormal findings on diagnostic imaging of other specified body structures: Secondary | ICD-10-CM | POA: Insufficient documentation

## 2017-03-27 DIAGNOSIS — Z9049 Acquired absence of other specified parts of digestive tract: Secondary | ICD-10-CM | POA: Insufficient documentation

## 2017-03-27 DIAGNOSIS — R911 Solitary pulmonary nodule: Secondary | ICD-10-CM | POA: Insufficient documentation

## 2017-11-16 IMAGING — CT CT ABD-PELV W/O CM
4 of 8 series · 8 of 32 positions shown, 11 images · non-contrast
Comparison: MRI 01/20/2017.

ADDENDUM:
There is a a soft tissue nodule inferior to the cecum measuring
x 2.3 cm. Although no definite communication or connection seen to
the cecum, this could be within the appendix. Cannot exclude
appendiceal mass. I would recommend follow-up CT in 3 months with
oral and IV contrast to assess for change and further characterize.

The areas of the right lower lobe pulmonary nodule and the soft
tissue mass adjacent to the cecum were not included on prior MRI.
Therefore, change or stability cannot be commented on from that
study.
CLINICAL DATA: Research exam
EXAM:
CT ABDOMEN AND PELVIS WITHOUT CONTRAST
TECHNIQUE: Multidetector CT imaging of the abdomen and pelvis was performed
following the standard protocol without IV contrast.

[Series 17: art de thins via #pp mixed#0 · axial · 0.86mm/px · z∈[+924,+1084]mm · 2 of 241 slices shown, 5 images]
[im 81/241  soft-tissue]
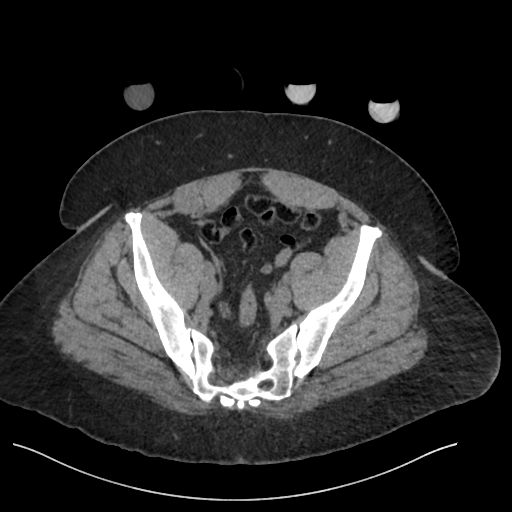
[im 81/241  lung]
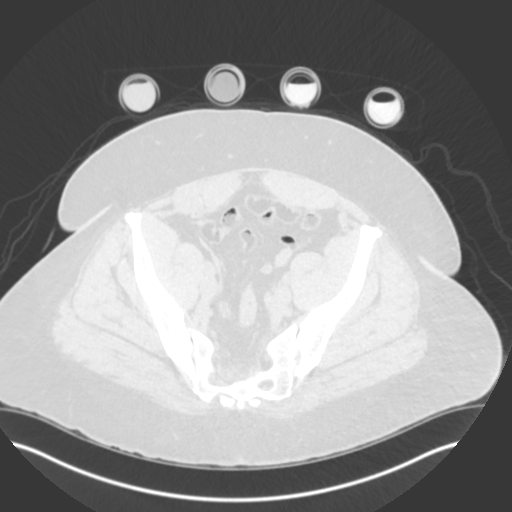
[im 81/241  bone]
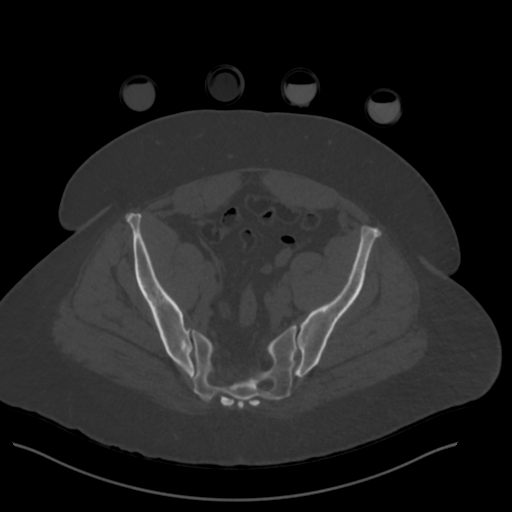
[im 161/241  soft-tissue]
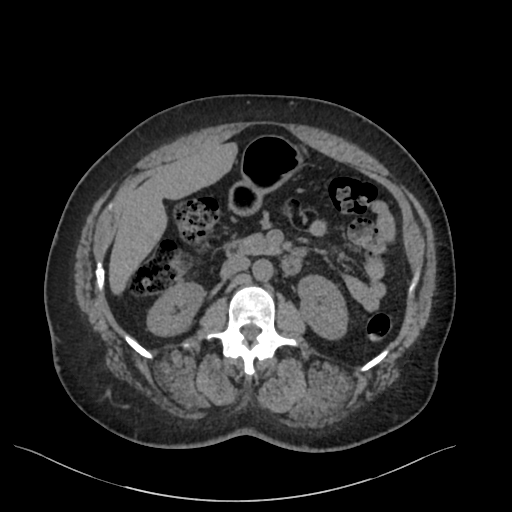
[im 161/241  lung]
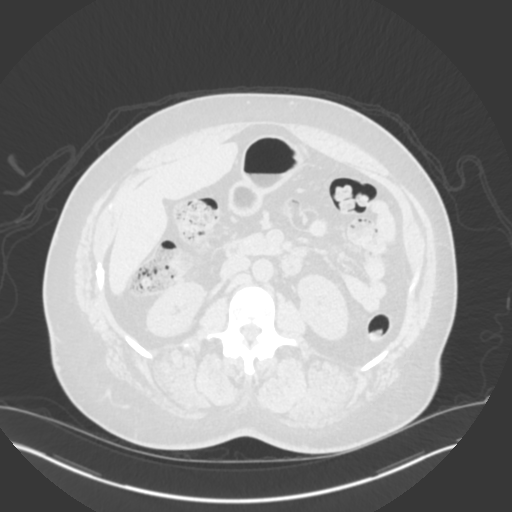

[Series 23: art de thins via #pp monoenergetic plus 40 kev#0 · axial · 0.86mm/px · z∈[+924,+1084]mm · 2 of 241 slices shown]
[im 81/241  soft-tissue]
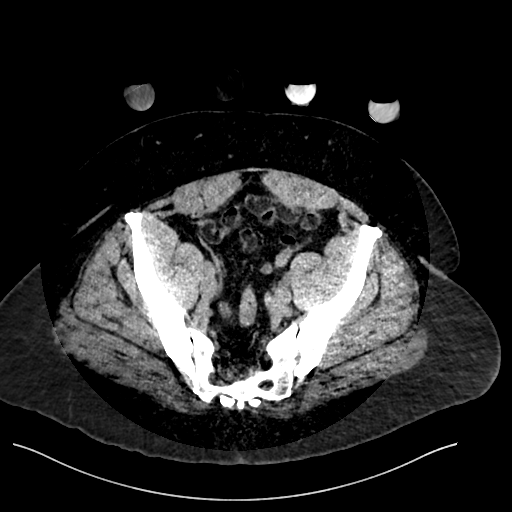
[im 161/241  soft-tissue]
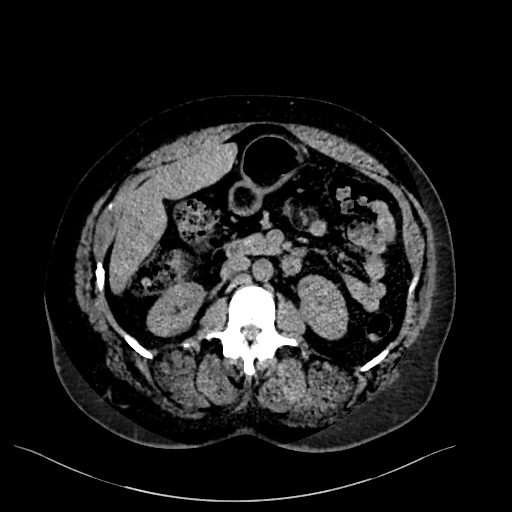

[Series 24: art de thins via #pp monoenergetic plus 50 kev#0 · axial · 0.86mm/px · z∈[+924,+1084]mm · 2 of 241 slices shown]
[im 81/241  soft-tissue]
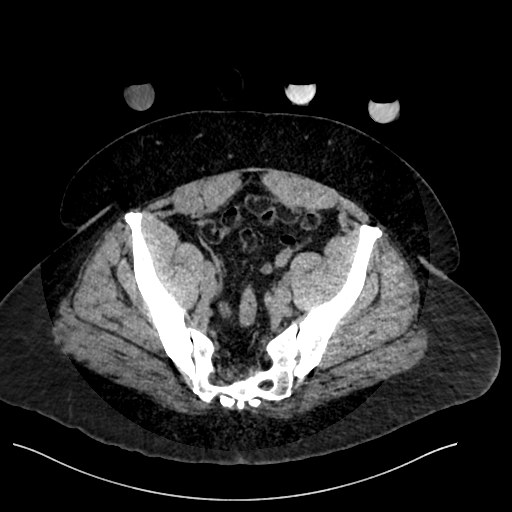
[im 161/241  soft-tissue]
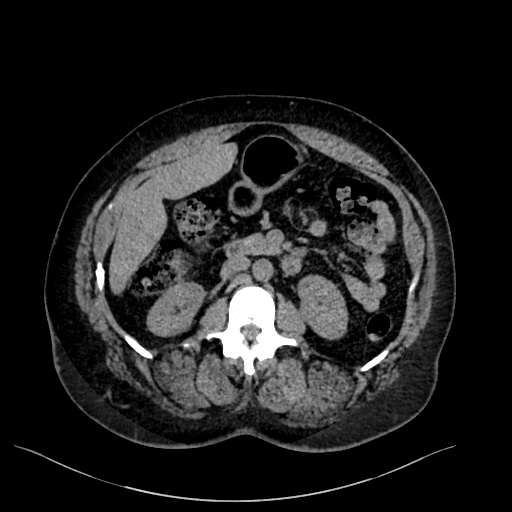

[Series 25: art de thins via #pp monoenergetic plus 65 kev#0 · axial · 0.86mm/px · z∈[+924,+1084]mm · 2 of 241 slices shown]
[im 81/241  soft-tissue]
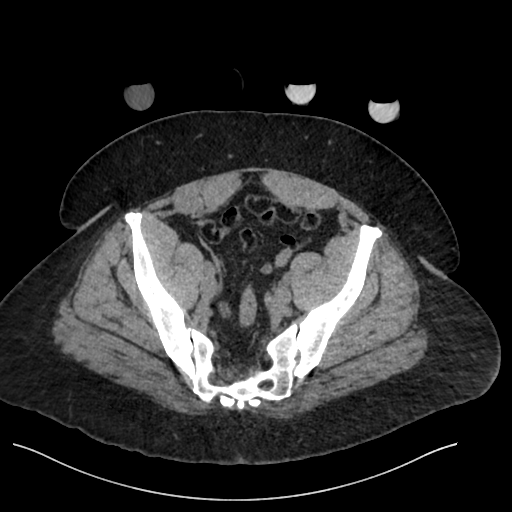
[im 161/241  soft-tissue]
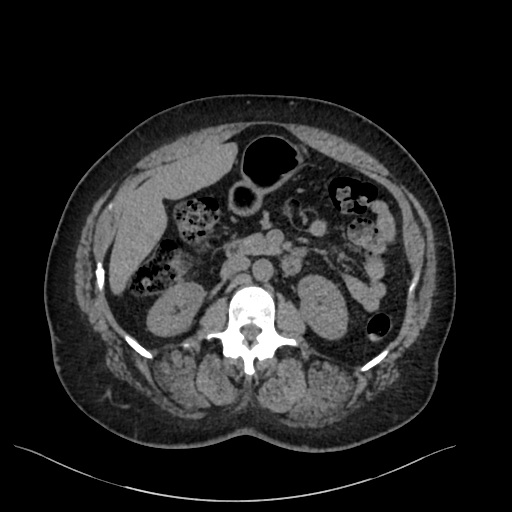

[8 of 32 positions shown; findings below may reference images not displayed]

FINDINGS: Lower chest: 10 mm nodule in the medial right lower lobe. Scarring
peripherally at the right lung base. No effusions. Heart is normal
size.

Hepatobiliary: No focal liver abnormality is seen. Status post
cholecystectomy. No biliary dilatation.

Pancreas: No focal abnormality or ductal dilatation.

Spleen: No focal abnormality.  Normal size.

Adrenals/Urinary Tract: No adrenal abnormality. No focal renal
abnormality. No stones or hydronephrosis. Urinary bladder is
unremarkable.

Stomach/Bowel: Stomach, large and small bowel grossly unremarkable.
Appendix not visualized, question prior appendectomy.

Vascular/Lymphatic: No evidence of aneurysm or adenopathy.

Reproductive: Prior hysterectomy.  No adnexal masses.

Other: No free fluid or free air.

Musculoskeletal: No acute bony abnormality.
IMPRESSION: 10 mm right lower lobe pulmonary nodule. Consider one of the
following in 3 months for both low-risk and high-risk individuals:
(a) repeat chest CT, (b) follow-up PET-CT, or (c) tissue sampling.
This recommendation follows the consensus statement: Guidelines for
Management of Incidental Pulmonary Nodules Detected on CT Images:

No acute findings in the abdomen or pelvis.

Prior cholecystectomy and hysterectomy.

## 2018-07-05 ENCOUNTER — Emergency Department (HOSPITAL_BASED_OUTPATIENT_CLINIC_OR_DEPARTMENT_OTHER)
Admission: EM | Admit: 2018-07-05 | Discharge: 2018-07-05 | Disposition: A | Discharge status: Home / Self Care | Payer: Medicare HMO | Attending: Emergency Medicine | Admitting: Emergency Medicine

## 2018-07-05 ENCOUNTER — Encounter (HOSPITAL_BASED_OUTPATIENT_CLINIC_OR_DEPARTMENT_OTHER): Payer: Self-pay | Admitting: Emergency Medicine

## 2018-07-05 ENCOUNTER — Emergency Department (HOSPITAL_BASED_OUTPATIENT_CLINIC_OR_DEPARTMENT_OTHER): Payer: Medicare HMO

## 2018-07-05 ENCOUNTER — Other Ambulatory Visit: Payer: Self-pay

## 2018-07-05 DIAGNOSIS — R110 Nausea: Secondary | ICD-10-CM | POA: Insufficient documentation

## 2018-07-05 DIAGNOSIS — J111 Influenza due to unidentified influenza virus with other respiratory manifestations: Secondary | ICD-10-CM | POA: Diagnosis not present

## 2018-07-05 DIAGNOSIS — J189 Pneumonia, unspecified organism: Secondary | ICD-10-CM

## 2018-07-05 DIAGNOSIS — M791 Myalgia, unspecified site: Secondary | ICD-10-CM | POA: Insufficient documentation

## 2018-07-05 DIAGNOSIS — M25512 Pain in left shoulder: Secondary | ICD-10-CM | POA: Insufficient documentation

## 2018-07-05 DIAGNOSIS — R69 Illness, unspecified: Secondary | ICD-10-CM

## 2018-07-05 DIAGNOSIS — R51 Headache: Secondary | ICD-10-CM | POA: Diagnosis present

## 2018-07-05 DIAGNOSIS — R05 Cough: Secondary | ICD-10-CM | POA: Insufficient documentation

## 2018-07-05 HISTORY — DX: Migraine, unspecified, not intractable, without status migrainosus: G43.909

## 2018-07-05 HISTORY — DX: Dizziness and giddiness: R42

## 2018-07-05 LAB — BASIC METABOLIC PANEL
ANION GAP: 6 (ref 5–15)
BUN: 8 mg/dL (ref 6–20)
CALCIUM: 9 mg/dL (ref 8.9–10.3)
CHLORIDE: 104 mmol/L (ref 98–111)
CO2: 24 mmol/L (ref 22–32)
CREATININE: 1.15 mg/dL — AB (ref 0.44–1.00)
GFR calc non Af Amer: 53 mL/min — ABNORMAL LOW (ref >60)
Glucose, Bld: 117 mg/dL — ABNORMAL HIGH (ref 70–99)
Potassium: 4 mmol/L (ref 3.5–5.1)
SODIUM: 134 mmol/L — AB (ref 135–145)

## 2018-07-05 LAB — CBC
HEMATOCRIT: 40.9 % (ref 36.0–46.0)
Hemoglobin: 12.4 g/dL (ref 12.0–15.0)
MCH: 27.9 pg (ref 26.0–34.0)
MCHC: 30.3 g/dL (ref 30.0–36.0)
MCV: 91.9 fL (ref 80.0–100.0)
NRBC: 0 % (ref 0.0–0.2)
PLATELETS: 242 10*3/uL (ref 150–400)
RBC: 4.45 MIL/uL (ref 3.87–5.11)
RDW: 14.4 % (ref 11.5–15.5)
WBC: 4.7 10*3/uL (ref 4.0–10.5)

## 2018-07-05 LAB — INFLUENZA PANEL BY PCR (TYPE A & B)
INFLBPCR: NEGATIVE
Influenza A By PCR: POSITIVE — AB

## 2018-07-05 MED ORDER — ONDANSETRON 4 MG PO TBDP: 4.0000 mg | ORAL_TABLET | Freq: Three times a day (TID) | ORAL | 0 refills | Status: AC | PRN

## 2018-07-05 MED ORDER — KETOROLAC TROMETHAMINE 30 MG/ML IJ SOLN
30.0000 mg | Freq: Once | INTRAMUSCULAR | Status: AC
  Administered 2018-07-05: 30 mg via INTRAVENOUS
  Filled 2018-07-05: qty 1

## 2018-07-05 MED ORDER — ACETAMINOPHEN 325 MG PO TABS
ORAL_TABLET | ORAL | Status: AC
  Filled 2018-07-05: qty 3

## 2018-07-05 MED ORDER — DOXYCYCLINE HYCLATE 100 MG PO CAPS: 100.0000 mg | ORAL_CAPSULE | Freq: Two times a day (BID) | ORAL | 0 refills | Status: AC

## 2018-07-05 MED ORDER — ONDANSETRON HCL 4 MG/2ML IJ SOLN
4.0000 mg | Freq: Once | INTRAMUSCULAR | Status: AC
  Administered 2018-07-05: 4 mg via INTRAVENOUS
  Filled 2018-07-05: qty 2

## 2018-07-05 MED ORDER — SODIUM CHLORIDE 0.9 % IV BOLUS
500.0000 mL | Freq: Once | INTRAVENOUS | Status: AC
  Administered 2018-07-05: 500 mL via INTRAVENOUS

## 2018-07-05 MED ORDER — ACETAMINOPHEN 325 MG PO TABS
975.0000 mg | ORAL_TABLET | Freq: Once | ORAL | Status: AC
  Administered 2018-07-05: 975 mg via ORAL

## 2018-07-05 MED ORDER — OSELTAMIVIR PHOSPHATE 75 MG PO CAPS: 75.0000 mg | ORAL_CAPSULE | Freq: Two times a day (BID) | ORAL | 0 refills | Status: AC

## 2018-07-05 NOTE — ED Triage Notes (Signed)
Headache, nausea, body aches, weakness since yesterday.

## 2018-07-05 NOTE — Discharge Instructions (Signed)
Your x-Files had a potential pneumonia.  Take the antibiotics.  However it also seems like a potential flu.  The flu test has been sent and you need to follow online to see if it comes back positive if it comes back off positive feel the Tamiflu

## 2018-07-05 NOTE — ED Provider Notes (Signed)
[facility] EMERGENCY DEPARTMENT Provider Note   CSN: [id number] Arrival date & time: 07/05/18  1122    History   Chief Complaint Chief Complaint  Patient presents with  . Headache    HPI Shannon Perkins is a 58 y.o. female.     HPI Patient presents with headache nausea body aches.  Began yesterday.  States she feels bad all over.  Has a cough with mild sputum production.  History of multiple sclerosis.  Has had chills.  Also has had some fevers.  No dysuria.  Dull sore throat.  States she feels bad everywhere.  Also pain in left shoulder which is not unusual for her. Past Medical History:  Diagnosis Date  . Arthritis   . Bladder disorder   . Migraines   . MS (multiple sclerosis) (HCC)   . Spinal stenosis   . Vertigo     There are no active problems to display for this patient.   Past Surgical History:  Procedure Laterality Date  . ABDOMINAL HYSTERECTOMY    . NECK SURGERY       OB History   No obstetric history on file.      Home Medications    Prior to Admission medications   Medication Sig Start Date End Date Taking? Authorizing Provider  doxycycline (VIBRAMYCIN) 100 MG capsule Take 1 capsule (100 mg total) by mouth 2 (two) times daily. 07/05/18   Benjiman Core, MD  HYDROcodone-acetaminophen (NORCO) 5-325 MG tablet Take 1-2 tablets by mouth every 6 (six) hours as needed for severe pain. 08/11/15   Doug Sou, MD  ondansetron (ZOFRAN-ODT) 4 MG disintegrating tablet Take 1 tablet (4 mg total) by mouth every 8 (eight) hours as needed for nausea or vomiting. 07/05/18   Benjiman Core, MD  oseltamivir (TAMIFLU) 75 MG capsule Take 1 capsule (75 mg total) by mouth every 12 (twelve) hours. 07/05/18   Benjiman Core, MD    Family History Family History  Problem Relation Age of Onset  . Rashes / Skin problems Mother     Social History Social History   Tobacco Use  . Smoking status: Never Smoker  . Smokeless tobacco: Never Used  Substance  Use Topics  . Alcohol use: Not Currently  . Drug use: No     Allergies   Other   Review of Systems Review of Systems  Constitutional: Positive for appetite change and fever.  HENT: Positive for congestion and sore throat.   Respiratory: Positive for cough.   Cardiovascular: Negative for chest pain.  Gastrointestinal: Positive for nausea. Negative for abdominal pain.  Genitourinary: Negative for flank pain.  Musculoskeletal: Positive for myalgias.  Skin: Negative for rash.  Neurological: Negative for weakness.  Hematological: Negative for adenopathy.  Psychiatric/Behavioral: Negative for confusion.     Physical Exam Updated Vital Signs BP 127/72 (BP Location: Right Arm)   Pulse 77   Temp 100.3 F (37.9 C) (Oral)   Resp (!) 28   Ht 5\' 5"  (1.651 m)   Wt 112.9 kg   SpO2 96%   BMI 41.44 kg/m   Physical Exam Constitutional:      Comments: Patient appears uncomfortable  HENT:     Head: Atraumatic.  Eyes:     Pupils: Pupils are equal, round, and reactive to light.  Cardiovascular:     Rate and Rhythm: Regular rhythm.  Pulmonary:     Comments: Mildly harsh breath sounds Abdominal:     Tenderness: There is no abdominal tenderness.  Musculoskeletal:  Comments: Some pain with movement at left shoulder.  Reportedly somewhat chronic.  No erythema.  Neurovascular intact in left hand.  Skin:    General: Skin is warm.  Neurological:     Mental Status: She is alert.  Psychiatric:        Mood and Affect: Mood normal.      ED Treatments / Results  Labs (all labs ordered are listed, but only abnormal results are displayed) Labs Reviewed  BASIC METABOLIC PANEL - Abnormal; Notable for the following components:      Result Value   Sodium 134 (*)    Glucose, Bld 117 (*)    Creatinine, Ser 1.15 (*)    GFR calc non Af Amer 53 (*)    All other components within normal limits  CBC  INFLUENZA PANEL BY PCR (TYPE A & B)    EKG None  Radiology Dg Chest 2  View  Result Date: 07/05/2018 CLINICAL DATA:  Cough, fever and shortness of breath. EXAM: CHEST - 2 VIEW COMPARISON:  None. FINDINGS: Cardiomediastinal silhouette is normal. Patchy airspace opacity RIGHT costophrenic angle. No pleural effusion. Bronchitic changes. Trachea projects midline and there is no pneumothorax. Soft tissue planes and included osseous structures are non-suspicious. Surgical clips in the included right abdomen compatible with cholecystectomy. ACDF. Mild thoracic spondylosis. IMPRESSION: Bronchitic changes with RIGHT lung base atelectasis versus pneumonia. Electronically Signed   By: Awilda Metro M.D.   On: 07/05/2018 13:18    Procedures Procedures (including critical care time)  Medications Ordered in ED Medications  ondansetron (ZOFRAN) injection 4 mg (4 mg Intravenous Given 07/05/18 1219)  sodium chloride 0.9 % bolus 500 mL (0 mLs Intravenous Stopped 07/05/18 1347)  ketorolac (TORADOL) 30 MG/ML injection 30 mg (30 mg Intravenous Given 07/05/18 1346)  acetaminophen (TYLENOL) tablet 975 mg (975 mg Oral Given 07/05/18 1426)     Initial Impression / Assessment and Plan / ED Course  I have reviewed the triage vital signs and the nursing notes.  Pertinent labs & imaging results that were available during my care of the patient were reviewed by me and considered in my medical decision making (see chart for details).       Patient with fever.  X-Reppucci showed pneumonia.  Feels somewhat better.  Not hypoxic.  However flulike illness also potentially.  Flu test sent and patient will follow.  If it is positive she will fill the Tamiflu.  Discharge home.  Final Clinical Impressions(s) / ED Diagnoses   Final diagnoses:  Community acquired pneumonia, unspecified laterality  Influenza-like illness    ED Discharge Orders         Ordered    doxycycline (VIBRAMYCIN) 100 MG capsule  2 times daily     07/05/18 1445    ondansetron (ZOFRAN-ODT) 4 MG disintegrating tablet  Every  8 hours PRN     07/05/18 1445    oseltamivir (TAMIFLU) 75 MG capsule  Every 12 hours     07/05/18 1445           Benjiman Core, MD 07/05/18 1611

## 2018-07-06 ENCOUNTER — Emergency Department (HOSPITAL_BASED_OUTPATIENT_CLINIC_OR_DEPARTMENT_OTHER)
Admission: EM | Admit: 2018-07-06 | Discharge: 2018-07-06 | Disposition: A | Discharge status: Home / Self Care | Payer: Medicare HMO | Attending: Emergency Medicine | Admitting: Emergency Medicine

## 2018-07-06 ENCOUNTER — Other Ambulatory Visit: Payer: Self-pay

## 2018-07-06 ENCOUNTER — Encounter (HOSPITAL_BASED_OUTPATIENT_CLINIC_OR_DEPARTMENT_OTHER): Payer: Self-pay | Admitting: Emergency Medicine

## 2018-07-06 DIAGNOSIS — E860 Dehydration: Secondary | ICD-10-CM | POA: Diagnosis not present

## 2018-07-06 DIAGNOSIS — G35 Multiple sclerosis: Secondary | ICD-10-CM | POA: Insufficient documentation

## 2018-07-06 DIAGNOSIS — M791 Myalgia, unspecified site: Secondary | ICD-10-CM | POA: Diagnosis present

## 2018-07-06 DIAGNOSIS — Z79899 Other long term (current) drug therapy: Secondary | ICD-10-CM | POA: Insufficient documentation

## 2018-07-06 DIAGNOSIS — J101 Influenza due to other identified influenza virus with other respiratory manifestations: Secondary | ICD-10-CM | POA: Insufficient documentation

## 2018-07-06 DIAGNOSIS — Z7982 Long term (current) use of aspirin: Secondary | ICD-10-CM | POA: Diagnosis not present

## 2018-07-06 MED ORDER — SODIUM CHLORIDE 0.9 % IV BOLUS
1000.0000 mL | Freq: Once | INTRAVENOUS | Status: AC
  Administered 2018-07-06: 1000 mL via INTRAVENOUS

## 2018-07-06 MED ORDER — METOCLOPRAMIDE HCL 5 MG/ML IJ SOLN
10.0000 mg | Freq: Once | INTRAMUSCULAR | Status: AC
  Administered 2018-07-06: 10 mg via INTRAVENOUS
  Filled 2018-07-06: qty 2

## 2018-07-06 MED ORDER — ACETAMINOPHEN 325 MG PO TABS
650.0000 mg | ORAL_TABLET | Freq: Once | ORAL | Status: AC
  Administered 2018-07-06: 650 mg via ORAL
  Filled 2018-07-06: qty 2

## 2018-07-06 NOTE — Discharge Instructions (Signed)
Plan on feeling terrible from the flu for the next week.  You need to drink plenty of fluids rest and do nothing.  Take Tylenol for the fever you can take the Tamiflu for the flu but sometimes that medication can cause nausea and vomiting so if it is causing those things do not take it.  Also take the doxycycline for the possible pneumonia.

## 2018-07-06 NOTE — ED Notes (Signed)
NAD at this time. Pt is stable and going home.  

## 2018-07-06 NOTE — ED Provider Notes (Signed)
[facility] EMERGENCY DEPARTMENT Provider Note   CSN: [id number] Arrival date & time: 07/06/18  8527    History   Chief Complaint Chief Complaint  Patient presents with  . Generalized Body Aches    HPI Tieasha Carchi is a 58 y.o. female.     Patient is a 58 year old female with a history of MS, spinal stenosis who was seen yesterday for generalized body aches, low-grade temperature, nausea, vomiting, cough and just feeling terrible.  Patient was diagnosed with possible pneumonia and possible flu.  She was given antibiotics and was discharged home.  Patient states since being home she has had ongoing generalized myalgias, fevers, chills, vomiting and just feeling miserable.  She has had an ongoing cough and nasal congestion.  She denies shortness of breath.  She states they told her to return if she was not feeling any better so she came back today.  She has not had any abdominal pain or urinary symptoms.  She has not noticed any rashes.  The history is provided by the patient.    Past Medical History:  Diagnosis Date  . Arthritis   . Bladder disorder   . Migraines   . MS (multiple sclerosis) (HCC)   . Spinal stenosis   . Vertigo     There are no active problems to display for this patient.   Past Surgical History:  Procedure Laterality Date  . ABDOMINAL HYSTERECTOMY    . CHOLECYSTECTOMY    . NECK SURGERY       OB History   No obstetric history on file.      Home Medications    Prior to Admission medications   Medication Sig Start Date End Date Taking? Authorizing Provider  aspirin EC 81 MG tablet Take by mouth. 09/03/14  Yes [provider]  baclofen (LIORESAL) 10 MG tablet TAKE 2 TABLETS BY MOUTH 3 TIMES A DAY 08/15/16  Yes [provider]  buPROPion (WELLBUTRIN XL) 300 MG 24 hr tablet Take by mouth. 07/05/16  Yes [provider]  etodolac (LODINE) 500 MG tablet Take by mouth. 11/11/17  Yes [provider]  furosemide  (LASIX) 40 MG tablet TAKE 1 TABLET (40 MG TOTAL) BY MOUTH DAILY. 07/05/16  Yes [provider]  Meclizine HCl 25 MG CHEW Chew by mouth. 11/11/17  Yes [provider]  Oxycodone HCl 10 MG TABS Take by mouth. 06/16/18 07/16/18 Yes [provider]  senna-docusate (SENOKOT-S) 8.6-50 MG tablet Take by mouth. 08/30/14  Yes [provider]  Vitamin D, Ergocalciferol, (DRISDOL) 1.25 MG (50000 UT) CAPS capsule TAKE 1 CAPSULE (50,000 UNITS TOTAL) BY MOUTH TWO (2) TIMES A WEEK. 04/25/16  Yes [provider]  aspirin-acetaminophen-caffeine (EXCEDRIN MIGRAINE) 782-423-53 MG tablet Take by mouth.    [provider]  Docusate Sodium 100 MG capsule Take by mouth.    [provider]  doxycycline (VIBRAMYCIN) 100 MG capsule Take 1 capsule (100 mg total) by mouth 2 (two) times daily. 07/05/18   Benjiman Core, MD  gabapentin (NEURONTIN) 300 MG capsule  06/04/18   [provider]  ondansetron (ZOFRAN-ODT) 4 MG disintegrating tablet Take 1 tablet (4 mg total) by mouth every 8 (eight) hours as needed for nausea or vomiting. 07/05/18   Benjiman Core, MD  oseltamivir (TAMIFLU) 75 MG capsule Take 1 capsule (75 mg total) by mouth every 12 (twelve) hours. 07/05/18   Benjiman Core, MD  potassium chloride (MICRO-K) 10 MEQ CR capsule  04/22/18   [provider]  TECFIDERA 240 MG CPDR  06/10/18   [provider]    Family History Family History  Problem Relation Age of Onset  . Rashes / Skin problems Mother     Social History Social History   Tobacco Use  . Smoking status: Never Smoker  . Smokeless tobacco: Never Used  Substance Use Topics  . Alcohol use: Not Currently  . Drug use: No     Allergies   Other   Review of Systems Review of Systems  All other systems reviewed and are negative.    Physical Exam Updated Vital Signs BP 118/73 (BP Location: Right Arm)   Pulse 68   Temp 98.6 F (37 C) (Oral)   Resp 14    SpO2 98%   Physical Exam Vitals signs and nursing note reviewed.  Constitutional:      General: She is not in acute distress.    Appearance: She is well-developed. She is ill-appearing.  HENT:     Head: Normocephalic and atraumatic.     Right Ear: Tympanic membrane normal.     Left Ear: Tympanic membrane normal.     Nose: Congestion and rhinorrhea present.     Mouth/Throat:     Mouth: Mucous membranes are dry.     Pharynx: Posterior oropharyngeal erythema present. No oropharyngeal exudate.  Eyes:     Pupils: Pupils are equal, round, and reactive to light.  Neck:     Musculoskeletal: Normal range of motion and neck supple. No muscular tenderness.  Cardiovascular:     Rate and Rhythm: Normal rate and regular rhythm.     Heart sounds: Normal heart sounds. No murmur. No friction rub.  Pulmonary:     Effort: Pulmonary effort is normal.     Breath sounds: Normal breath sounds. No wheezing or rales.  Abdominal:     General: Bowel sounds are normal. There is no distension.     Palpations: Abdomen is soft.     Tenderness: There is no abdominal tenderness. There is no guarding or rebound.  Musculoskeletal: Normal range of motion.        General: No tenderness.     Comments: No edema  Skin:    General: Skin is warm and dry.     Capillary Refill: Capillary refill takes less than 2 seconds.     Findings: No rash.  Neurological:     General: No focal deficit present.     Mental Status: She is alert and oriented to person, place, and time. Mental status is at baseline.     Cranial Nerves: No cranial nerve deficit.  Psychiatric:        Mood and Affect: Mood normal.        Behavior: Behavior normal.        Thought Content: Thought content normal.      ED Treatments / Results  Labs (all labs ordered are listed, but only abnormal results are displayed) Labs Reviewed - No data to display  EKG None  Radiology Dg Chest 2 View  Result Date: 07/05/2018 CLINICAL DATA:  Cough, fever  and shortness of breath. EXAM: CHEST - 2 VIEW COMPARISON:  None. FINDINGS: Cardiomediastinal silhouette is normal. Patchy airspace opacity RIGHT costophrenic angle. No pleural effusion. Bronchitic changes. Trachea projects midline and there is no pneumothorax. Soft tissue planes and included osseous structures are non-suspicious. Surgical clips in the included right abdomen compatible with cholecystectomy. ACDF. Mild thoracic spondylosis. IMPRESSION: Bronchitic changes with RIGHT lung base atelectasis versus pneumonia. Electronically Signed  By: Awilda Metro M.D.   On: 07/05/2018 13:18    Procedures Procedures (including critical care time)  Medications Ordered in ED Medications  metoCLOPramide (REGLAN) injection 10 mg (10 mg Intravenous Given 07/06/18 0853)  sodium chloride 0.9 % bolus 1,000 mL (1,000 mLs Intravenous New Bag/Given 07/06/18 0853)  acetaminophen (TYLENOL) tablet 650 mg (650 mg Oral Given 07/06/18 0845)     Initial Impression / Assessment and Plan / ED Course  I have reviewed the triage vital signs and the nursing notes.  Pertinent labs & imaging results that were available during my care of the patient were reviewed by me and considered in my medical decision making (see chart for details).       Pt with symptoms consistent with influenza and test done yesterday came back is flu a positive.  No signs of breathing difficulty  No signs of strep pharyngitis, otitis or abnormal abdominal findings.  Patient of peers to just feel miserable.  Vital signs are reassuring.  She is in no sign of respiratory distress and is satting 98% on room air.  X-[place] from yesterday did show atelectasis versus pneumonia.  However feel that most of patient's symptoms are related to the flu.  This is day 3 of illness.  Will give symptomatic treatment with IV fluids and Reglan for the nausea and headache. Will continue antipyretica and rest and fluids and return for any further problems.   Final  Clinical Impressions(s) / ED Diagnoses   Final diagnoses:  Influenza A  Dehydration    ED Discharge Orders    None       Gwyneth Sprout, MD 07/06/18 1158

## 2018-07-06 NOTE — ED Triage Notes (Addendum)
Pt sts she was seen here yesterday for the same sx.  Sts she was told that if she didn't feel any better today to come back.  Continues to have body aches and cough but feels worse today than yesterday. Vomited x2 during the night.

## 2019-02-24 IMAGING — DX DG CHEST 2V
2 series · 2 of 2 positions shown · non-contrast
Comparison: None.

CLINICAL DATA: Cough, fever and shortness of breath.

EXAM:
CHEST - 2 VIEW

[chest pa]
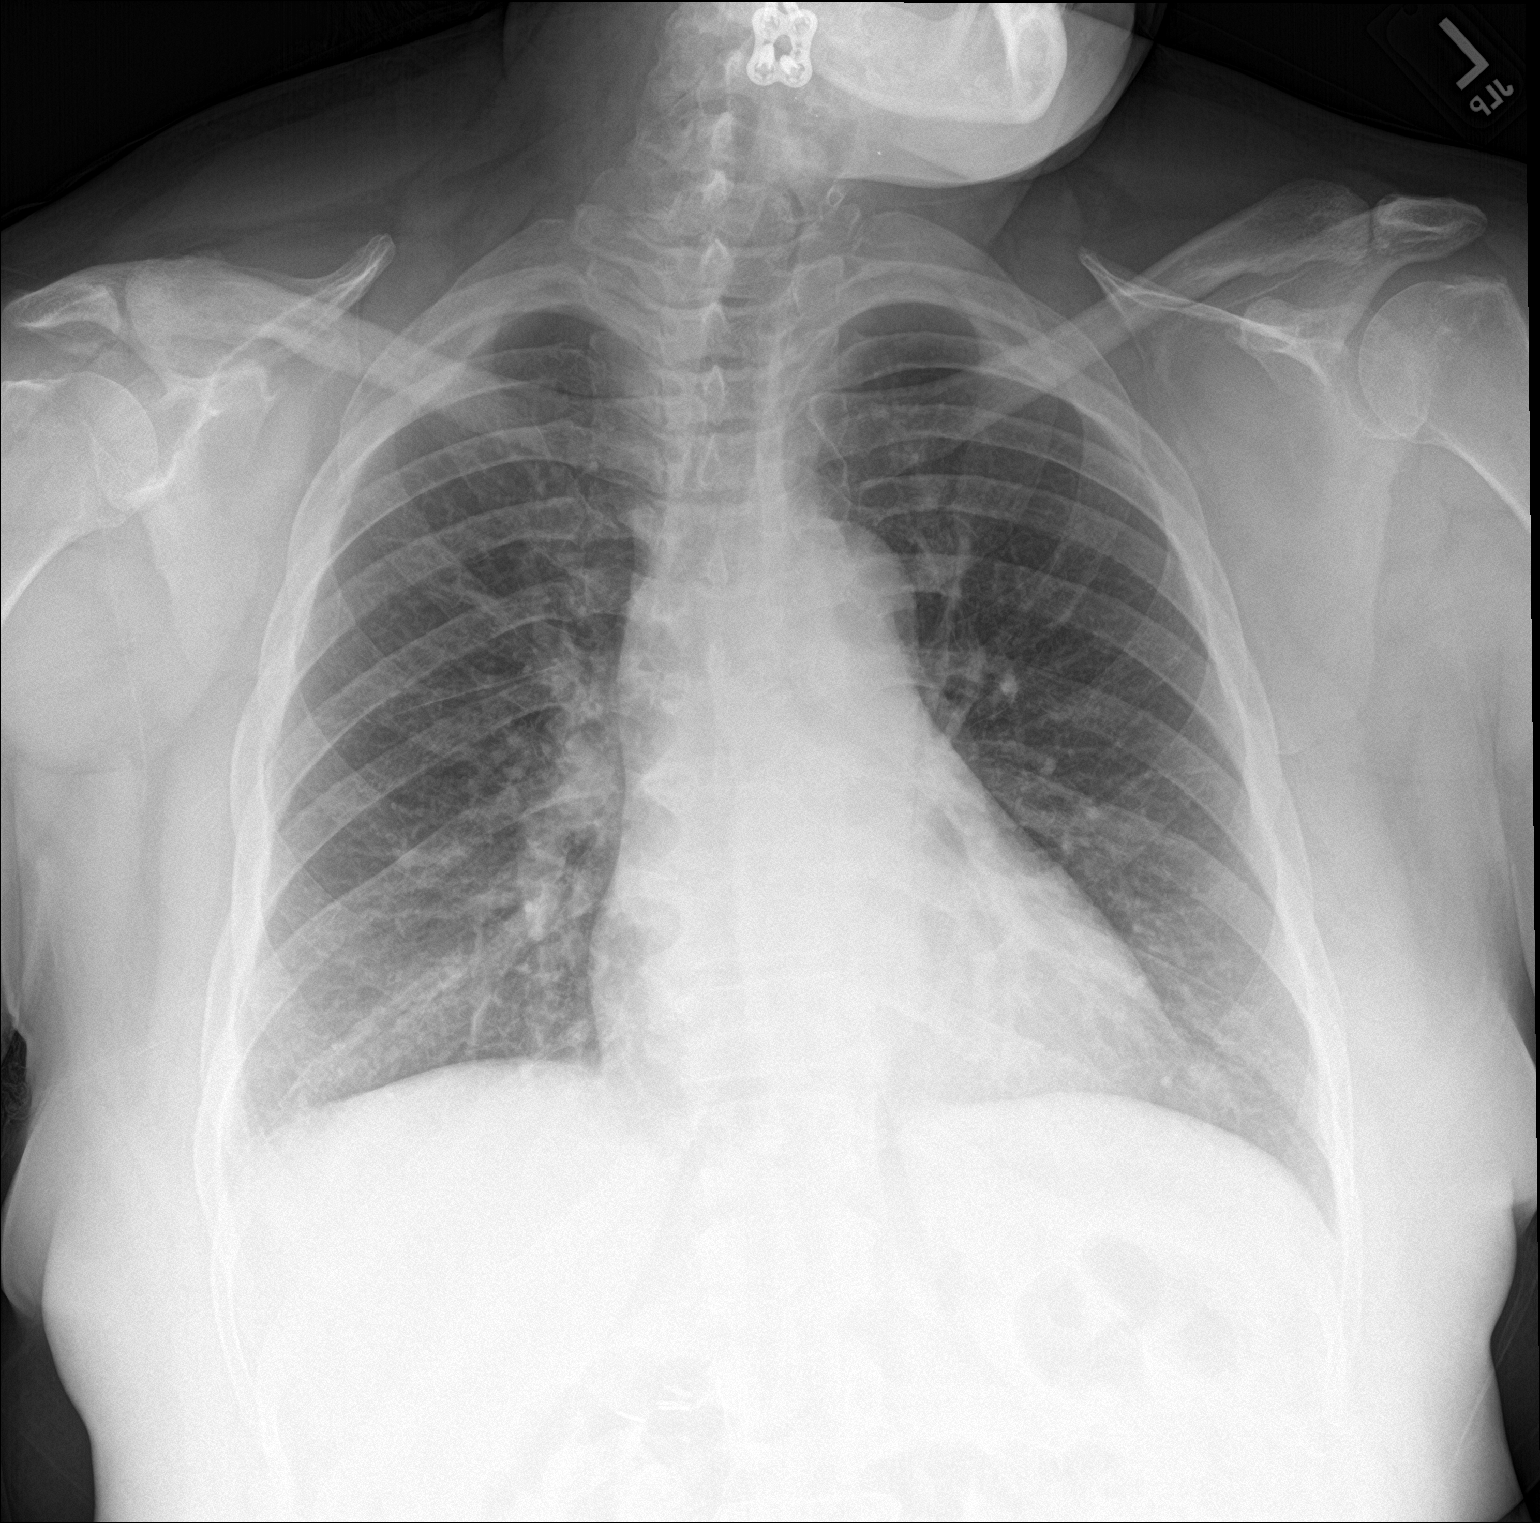

[chest lat]
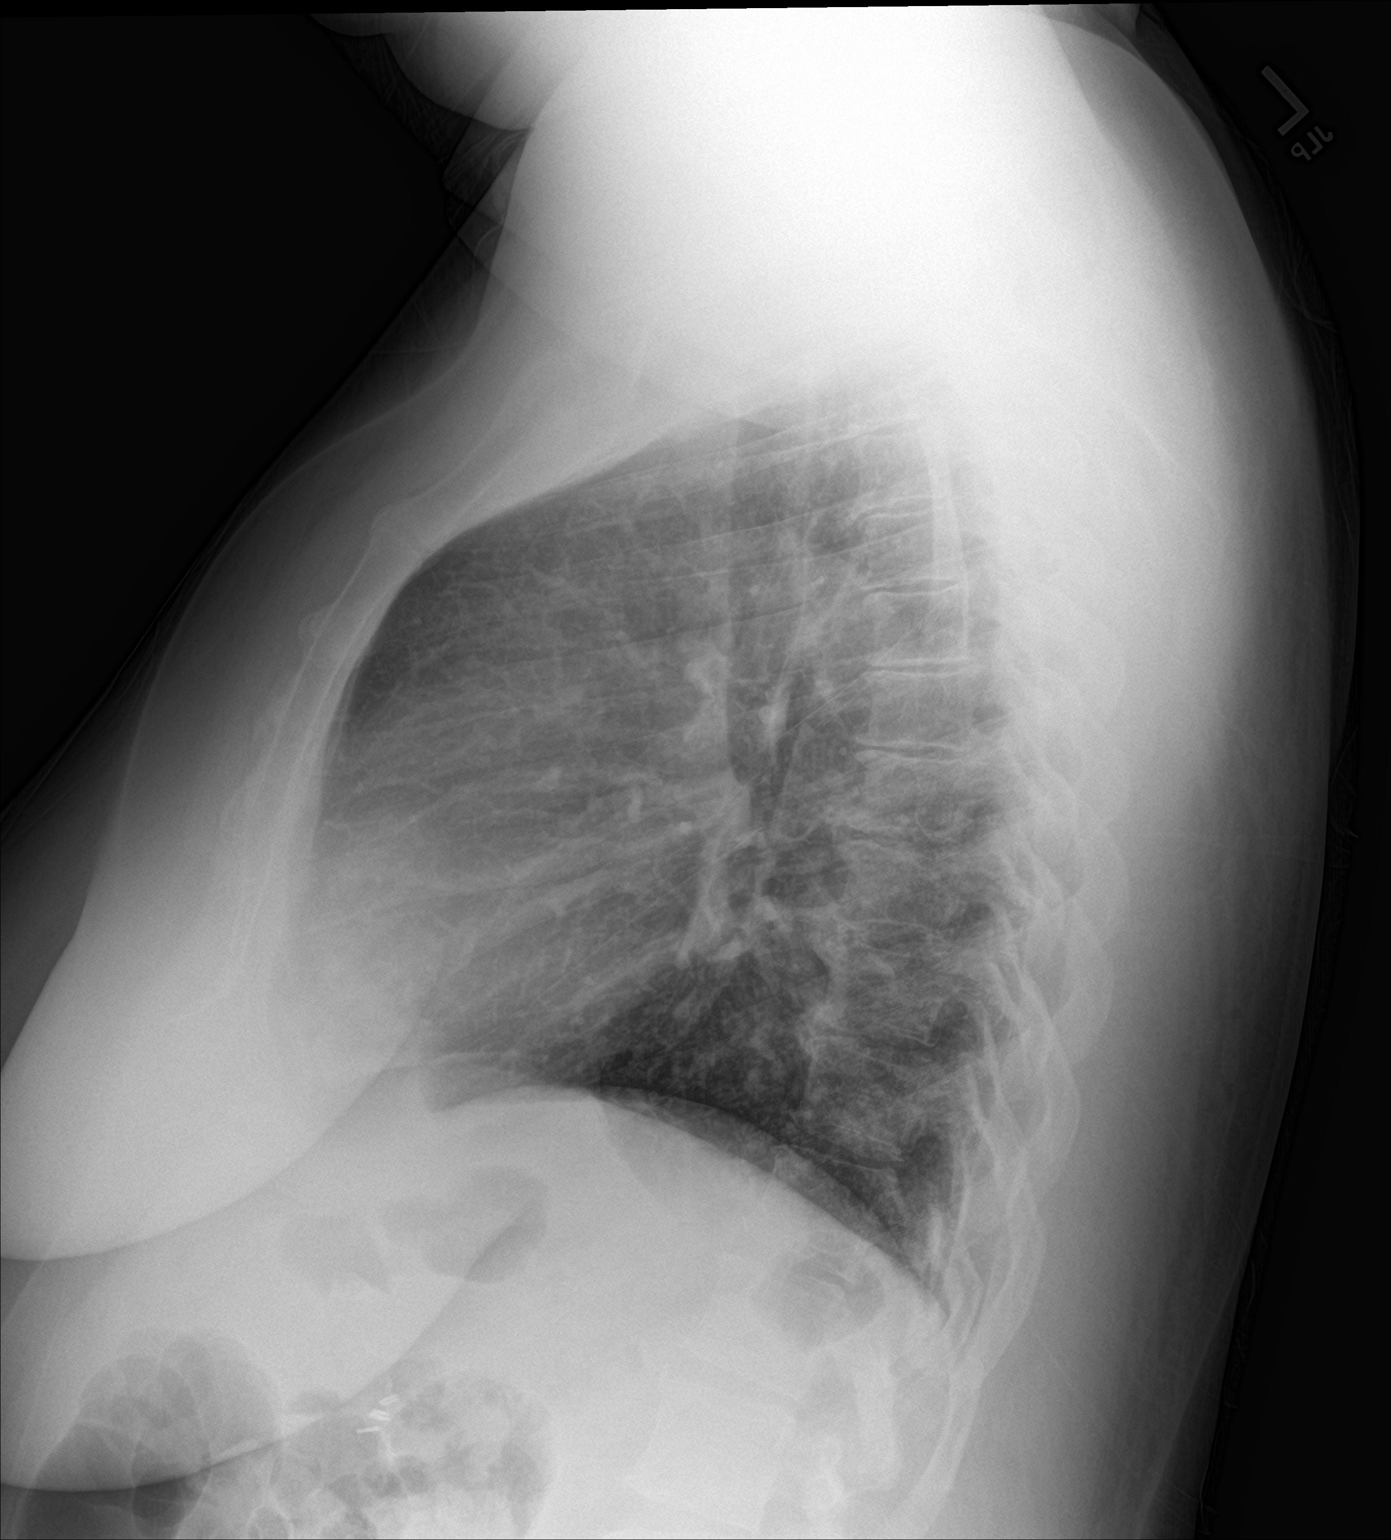

[2 of 2 positions shown; findings below may reference images not displayed]

FINDINGS: Cardiomediastinal silhouette is normal. Patchy airspace opacity
RIGHT costophrenic angle. No pleural effusion. Bronchitic changes.
Trachea projects midline and there is no pneumothorax. Soft tissue
planes and included osseous structures are non-suspicious. Surgical
clips in the included right abdomen compatible with cholecystectomy.
ACDF. Mild thoracic spondylosis.
IMPRESSION: Bronchitic changes with RIGHT lung base atelectasis versus
pneumonia.
